# Patient Record
Sex: Female | Born: 1995
Health system: Southern US, Community
[De-identification: ages and names within clinical notes are randomized; demographics above are authoritative.]

## PROBLEM LIST (undated history)

## (undated) DIAGNOSIS — F419 Anxiety disorder, unspecified: Secondary | ICD-10-CM

## (undated) DIAGNOSIS — F909 Attention-deficit hyperactivity disorder, unspecified type: Secondary | ICD-10-CM

---
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
## 2015-05-29 ENCOUNTER — Emergency Department (HOSPITAL_BASED_OUTPATIENT_CLINIC_OR_DEPARTMENT_OTHER): Payer: Self-pay

## 2015-05-29 ENCOUNTER — Emergency Department (HOSPITAL_BASED_OUTPATIENT_CLINIC_OR_DEPARTMENT_OTHER)
Admission: EM | Admit: 2015-05-29 | Discharge: 2015-05-29 | Disposition: A | Discharge status: Home / Self Care | Payer: Self-pay | Attending: Emergency Medicine | Admitting: Emergency Medicine

## 2015-05-29 ENCOUNTER — Encounter (HOSPITAL_BASED_OUTPATIENT_CLINIC_OR_DEPARTMENT_OTHER): Payer: Self-pay | Admitting: Emergency Medicine

## 2015-05-29 DIAGNOSIS — Z720 Tobacco use: Secondary | ICD-10-CM | POA: Insufficient documentation

## 2015-05-29 DIAGNOSIS — Y9289 Other specified places as the place of occurrence of the external cause: Secondary | ICD-10-CM | POA: Insufficient documentation

## 2015-05-29 DIAGNOSIS — W1839XA Other fall on same level, initial encounter: Secondary | ICD-10-CM | POA: Insufficient documentation

## 2015-05-29 DIAGNOSIS — Y998 Other external cause status: Secondary | ICD-10-CM | POA: Insufficient documentation

## 2015-05-29 DIAGNOSIS — S62609A Fracture of unspecified phalanx of unspecified finger, initial encounter for closed fracture: Secondary | ICD-10-CM

## 2015-05-29 DIAGNOSIS — Y9389 Activity, other specified: Secondary | ICD-10-CM | POA: Insufficient documentation

## 2015-05-29 DIAGNOSIS — S62616A Displaced fracture of proximal phalanx of right little finger, initial encounter for closed fracture: Secondary | ICD-10-CM | POA: Insufficient documentation

## 2015-05-29 DIAGNOSIS — Z8659 Personal history of other mental and behavioral disorders: Secondary | ICD-10-CM | POA: Insufficient documentation

## 2015-05-29 HISTORY — DX: Anxiety disorder, unspecified: F41.9

## 2015-05-29 MED ORDER — LIDOCAINE HCL 2 % IJ SOLN
10.0000 mL | Freq: Once | INTRAMUSCULAR | Status: AC
  Administered 2015-05-29: 200 mg via INTRADERMAL
  Filled 2015-05-29: qty 20

## 2015-05-29 MED ORDER — OXYCODONE-ACETAMINOPHEN 5-325 MG PO TABS
2.0000 | ORAL_TABLET | Freq: Once | ORAL | Status: AC
  Administered 2015-05-29: 2 via ORAL
  Filled 2015-05-29: qty 2

## 2015-05-29 MED ORDER — HYDROCODONE-ACETAMINOPHEN 5-325 MG PO TABS: 2.0000 | ORAL_TABLET | ORAL | Status: DC | PRN

## 2015-05-29 NOTE — ED Notes (Signed)
Patient states that she fell last night and hurt her left hand  - left 5th digit is swollen and painful. Patient is sitting in triage eating vienna sausages and reports pain is a 9/10

## 2015-05-29 NOTE — Discharge Instructions (Signed)
Your evaluated in the ED today for your left hand pain. You were found have a fracture of your smallest pinky finger. You have will need to go to the [facility] emergency department at 8:00 AM on Sunday morning for surgery in order to pin your bones together. Your surgeon will be Dr. Amanda Pea. Do not eat or drink anything after 12:00 midnight Sunday morning. You may take her pain medicines as prescribed for your discomfort. Do not take these medications before driving or operating machinery. Return to ED for worsening symptoms.  Finger Fracture Fractures of fingers are breaks in the bones of the fingers. There are many types of fractures. There are different ways of treating these fractures. Your health care provider will discuss the best way to treat your fracture. CAUSES Traumatic injury is the main cause of broken fingers. These include:  Injuries while playing sports.  Workplace injuries.  Falls. RISK FACTORS Activities that can increase your risk of finger fractures include:  Sports.  Workplace activities that involve machinery.  A condition called osteoporosis, which can make your bones less dense and cause them to fracture more easily. SIGNS AND SYMPTOMS The main symptoms of a broken finger are pain and swelling within 15 minutes after the injury. Other symptoms include:  Bruising of your finger.  Stiffness of your finger.  Numbness of your finger.  Exposed bones (compound fracture) if the fracture is severe. DIAGNOSIS  The best way to diagnose a broken bone is with X-ray imaging. Additionally, your health care provider will use this X-ray image to evaluate the position of the broken finger bones.  TREATMENT  Finger fractures can be treated with:   Nonreduction--This means the bones are in place. The finger is splinted without changing the positions of the bone pieces. The splint is usually left on for about a week to 10 days. This will depend on your fracture and what your  health care provider thinks.  Closed reduction--The bones are put back into position without using surgery. The finger is then splinted.  Open reduction and internal fixation--The fracture site is opened. Then the bone pieces are fixed into place with pins or some type of hardware. This is seldom required. It depends on the severity of the fracture. HOME CARE INSTRUCTIONS   Follow your health care provider's instructions regarding activities, exercises, and physical therapy.  Only take over-the-counter or prescription medicines for pain, discomfort, or fever as directed by your health care provider. SEEK MEDICAL CARE IF: You have pain or swelling that limits the motion or use of your fingers. SEEK IMMEDIATE MEDICAL CARE IF:  Your finger becomes numb. MAKE SURE YOU:   Understand these instructions.  Will watch your condition.  Will get help right away if you are not doing well or get worse.   This information is not intended to replace advice given to you by your health care provider. Make sure you discuss any questions you have with your health care provider.   Document Released: 11/19/2000 Document Revised: 05/28/2013 Document Reviewed: 03/19/2013 Elsevier Interactive Patient Education 2016 [employer]  Cast or Splint Care Casts and splints support injured limbs and keep bones from moving while they heal. It is important to care for your cast or splint at home.  HOME CARE INSTRUCTIONS  Keep the cast or splint uncovered during the drying period. It can take 24 to 48 hours to dry if it is made of plaster. A fiberglass cast will dry in less than 1 hour.  Do not rest the cast on anything harder than a pillow for the first 24 hours.  Do not put weight on your injured limb or apply pressure to the cast until your health care provider gives you permission.  Keep the cast or splint dry. Wet casts or splints can lose their shape and may not support the limb as well. A wet cast that  has lost its shape can also create harmful pressure on your skin when it dries. Also, wet skin can become infected.  Cover the cast or splint with a plastic bag when bathing or when out in the rain or snow. If the cast is on the trunk of the body, take sponge baths until the cast is removed.  If your cast does become wet, dry it with a towel or a blow dryer on the cool setting only.  Keep your cast or splint clean. Soiled casts may be wiped with a moistened cloth.  Do not place any hard or soft foreign objects under your cast or splint, such as cotton, toilet paper, lotion, or powder.  Do not try to scratch the skin under the cast with any object. The object could get stuck inside the cast. Also, scratching could lead to an infection. If itching is a problem, use a blow dryer on a cool setting to relieve discomfort.  Do not trim or cut your cast or remove padding from inside of it.  Exercise all joints next to the injury that are not immobilized by the cast or splint. For example, if you have a long leg cast, exercise the hip joint and toes. If you have an arm cast or splint, exercise the shoulder, elbow, thumb, and fingers.  Elevate your injured arm or leg on 1 or 2 pillows for the first 1 to 3 days to decrease swelling and pain.It is best if you can comfortably elevate your cast so it is higher than your heart. SEEK MEDICAL CARE IF:   Your cast or splint cracks.  Your cast or splint is too tight or too loose.  You have unbearable itching inside the cast.  Your cast becomes wet or develops a soft spot or area.  You have a bad smell coming from inside your cast.  You get an object stuck under your cast.  Your skin around the cast becomes red or raw.  You have new pain or worsening pain after the cast has been applied. SEEK IMMEDIATE MEDICAL CARE IF:   You have fluid leaking through the cast.  You are unable to move your fingers or toes.  You have discolored (blue or white),  cool, painful, or very swollen fingers or toes beyond the cast.  You have tingling or numbness around the injured area.  You have severe pain or pressure under the cast.  You have any difficulty with your breathing or have shortness of breath.  You have chest pain.   This information is not intended to replace advice given to you by your health care provider. Make sure you discuss any questions you have with your health care provider.   Document Released: 08/04/2000 Document Revised: 05/28/2013 Document Reviewed: 02/13/2013 Elsevier Interactive Patient Education [employer]

## 2015-05-29 NOTE — ED Provider Notes (Signed)
CSN: [id number]     Arrival date & time 05/29/15  1346 History   First MD Initiated Contact with Patient 05/29/15 1422     Chief Complaint  Patient presents with  . Finger Injury     (Consider location/radiation/quality/duration/timing/severity/associated sxs/prior Treatment) HPI Tanya Collins is a 19 y.o. female who comes in for evaluation of finger injury. Patient states last night she fell down while at the club landing on her right hand. She reports sudden onset pain to her right pinky finger. She reports associated swelling and redness. Movement makes her symptoms worse. Pain is throbbing. Rated as 10/10. Denies any numbness or tingling. She is holding her finger in flexion as any type of extension worsens the pain.  Past Medical History  Diagnosis Date  . Anxiety    History reviewed. No pertinent past surgical history. History reviewed. No pertinent family history. Social History  Substance Use Topics  . Smoking status: Current Every Day Smoker  . Smokeless tobacco: None  . Alcohol Use: Yes     Comment: occ   OB History    No data available     Review of Systems A 10 point review of systems was completed and was negative except for pertinent positives and negatives as mentioned in the history of present illness     Allergies  Review of patient's allergies indicates no known allergies.  Home Medications   Prior to Admission medications   Medication Sig Start Date End Date Taking? Authorizing Provider  HYDROcodone-acetaminophen (NORCO) 5-325 MG tablet Take 2 tablets by mouth every 4 (four) hours as needed. 05/29/15   Joycie Peek, PA-C   BP 116/64 mmHg  Pulse 71  Temp(Src) 98.6 F (37 C) (Oral)  Resp 16  Ht  (1.575 m)  Wt 153 lb (69.4 kg)  BMI 27.98 kg/m2  SpO2 100%  LMP 04/29/2015 Physical Exam  Constitutional:  Awake, alert, nontoxic appearance.  HENT:  Head: Atraumatic.  Eyes: Right eye exhibits no discharge. Left eye exhibits no discharge.   Neck: Neck supple.  Pulmonary/Chest: Effort normal. She exhibits no tenderness.  Abdominal: Soft. There is no tenderness. There is no rebound.  Musculoskeletal: She exhibits no tenderness.  Right fifth digit out in flexion. Significant swelling and ecchymosis to the PIP joint and proximal phalanx. No open wounds. Distal pulses are intact with brisk cap refill.  Neurological:  Mental status and motor strength appears baseline for patient and situation. Motor strength is decreased in right fifth digit due to pain. Sensation intact to light touch.  Skin: No rash noted.  Psychiatric: She has a normal mood and affect.  Nursing note and vitals reviewed.   ED Course  Reduction of fracture Date/Time: 05/29/2015 3:37 PM Performed by: Joycie Peek Authorized by: Joycie Peek Consent: Verbal consent obtained. Risks and benefits: risks, benefits and alternatives were discussed Consent given by: patient Patient understanding: patient states understanding of the procedure being performed Patient identity confirmed: verbally with patient Preparation: Patient was prepped and draped in the usual sterile fashion. Local anesthesia used: yes Anesthesia: digital block Local anesthetic: lidocaine 2% without epinephrine Anesthetic total: 2 ml Patient sedated: no Patient tolerance: Patient tolerated the procedure well with no immediate complications Comments: Patient with improved range of motion following reduction.   SPLINT APPLICATION Date/Time: 4:47 PM Authorized by: Sharlene Motts Consent: Verbal consent obtained. Risks and benefits: risks, benefits and alternatives were discussed Consent given by: patient Splint applied by: orthopedic technician Location details: Left hand and wrist  Splint type: Ulnar gutter  Supplies used: wrap and cast form Post-procedure: The splinted body part was neurovascularly unchanged following the procedure. Patient tolerance: Patient tolerated the  procedure well with no immediate complications.    (including critical care time)  Labs Review Labs Reviewed - No data to display  Imaging Review Dg Hand Complete Right  05/29/2015   CLINICAL DATA:  Patient states that she fell last night and hurt her left hand - left 5th digit is swollen and painful. Pt unable to move or extend 5th digit, last image image is anteroposterior of just 5th digit to better demonstrate the 1st and 2nd phalanges  EXAM: RIGHT HAND - COMPLETE 3+ VIEW  COMPARISON:  None.  FINDINGS: Transverse comminuted fracture of the proximal shaft proximal phalanx right little finger, with several mm of palmar displacement and dorsal angulation of the distal fracture fragment. There is no intra-articular extension of the fracture. The PIP is held in flexion. No other acute bony injuries at evident. Elsewhere normal mineralization and alignment without significant osseous degenerative change.  IMPRESSION: 1. Comminuted angulated fracture, proximal phalanx right little finger.   Electronically Signed   By: Corlis Leak M.D.   On: 05/29/2015 14:55   I have personally reviewed and evaluated these images and lab results as part of my medical decision-making.   EKG Interpretation None     Meds given in ED:  Medications  lidocaine (XYLOCAINE) 2 % (with pres) injection 200 mg (not administered)  oxyCODONE-acetaminophen (PERCOCET/ROXICET) 5-325 MG per tablet 2 tablet (2 tablets Oral Given 05/29/15 1512)    New Prescriptions   HYDROCODONE-ACETAMINOPHEN (NORCO) 5-325 MG TABLET    Take 2 tablets by mouth every 4 (four) hours as needed.   Filed Vitals:   05/29/15 1353  BP: 116/64  Pulse: 71  Temp: 98.6 F (37 C)  TempSrc: Oral  Resp: 16  Height:  (1.575 m)  Weight: 153 lb (69.4 kg)  SpO2: 100%    MDM  Patient presents to ED for right finger pain after sustaining a fall last night.  Vitals stable - WNL -afebrile Pt resting comfortably in ED. pain treated in ED. PE--right  fifth digit held in flexion. Unable to extend due to pain. Neurovascularly intact. Imaging--x-ray of right fifth digit FINDINGS: Transverse comminuted fracture of the proximal shaft proximal phalanx right little finger, with several mm of palmar displacement and dorsal angulation of the distal fracture fragment. There is no intra-articular extension of the fracture. The PIP is held in flexion  Will consult with hand surgery. Discussed with Dr. Amanda Pea. Will place patient in splint. Will need to be seen for surgical pinning. Patient recently ate PTA and will be unable to have surgery performed today. We'll send to [facility] ED 8:00 AM Sunday morning for surgical evaluation. I discussed all relevant lab findings and imaging results with pt and they verbalized understanding. Discussed f/u with PCP within 48 hrs and return precautions, pt very amenable to plan. Prior to patient discharge, I discussed and reviewed this case with Dr.Gentry  Final diagnoses:  Finger fracture, left, closed, initial encounter        Joycie Peek, PA-C 05/29/15 2049  Mirian Mo, MD 06/05/15 [phone]

## 2015-05-30 ENCOUNTER — Ambulatory Visit (HOSPITAL_COMMUNITY)
Admission: RE | Admit: 2015-05-30 | Discharge: 2015-05-30 | Disposition: A | Discharge status: Home / Self Care | Payer: Self-pay | Source: Ambulatory Visit | Attending: Orthopedic Surgery | Admitting: Orthopedic Surgery

## 2015-05-30 ENCOUNTER — Encounter (HOSPITAL_COMMUNITY)
Admission: RE | Disposition: A | Discharge status: Home / Self Care | Payer: Self-pay | Source: Ambulatory Visit | Attending: Orthopedic Surgery

## 2015-05-30 ENCOUNTER — Ambulatory Visit (HOSPITAL_COMMUNITY): Payer: Self-pay | Admitting: Anesthesiology

## 2015-05-30 DIAGNOSIS — W19XXXA Unspecified fall, initial encounter: Secondary | ICD-10-CM | POA: Insufficient documentation

## 2015-05-30 DIAGNOSIS — F17200 Nicotine dependence, unspecified, uncomplicated: Secondary | ICD-10-CM | POA: Insufficient documentation

## 2015-05-30 DIAGNOSIS — S62616A Displaced fracture of proximal phalanx of right little finger, initial encounter for closed fracture: Secondary | ICD-10-CM | POA: Insufficient documentation

## 2015-05-30 DIAGNOSIS — Z79891 Long term (current) use of opiate analgesic: Secondary | ICD-10-CM | POA: Insufficient documentation

## 2015-05-30 HISTORY — PX: CLOSED REDUCTION FINGER WITH PERCUTANEOUS PINNING: SHX5612

## 2015-05-30 SURGERY — CLOSED REDUCTION, FINGER, WITH PERCUTANEOUS PINNING
Anesthesia: General | Laterality: Right

## 2015-05-30 MED ORDER — OXYCODONE HCL 5 MG/5ML PO SOLN: 5.0000 mg | Freq: Once | ORAL | Status: AC | PRN

## 2015-05-30 MED ORDER — MIDAZOLAM HCL 5 MG/5ML IJ SOLN
INTRAMUSCULAR | Status: DC | PRN
  Administered 2015-05-30: 2 mg via INTRAVENOUS

## 2015-05-30 MED ORDER — LIDOCAINE HCL (CARDIAC) 20 MG/ML IV SOLN
INTRAVENOUS | Status: AC
  Filled 2015-05-30: qty 5

## 2015-05-30 MED ORDER — ROCURONIUM BROMIDE 50 MG/5ML IV SOLN
INTRAVENOUS | Status: AC
  Filled 2015-05-30: qty 1

## 2015-05-30 MED ORDER — HYDROCODONE-ACETAMINOPHEN 5-325 MG PO TABS: 2.0000 | ORAL_TABLET | Freq: Four times a day (QID) | ORAL | Status: DC | PRN

## 2015-05-30 MED ORDER — NEOSTIGMINE METHYLSULFATE 10 MG/10ML IV SOLN
INTRAVENOUS | Status: AC
  Filled 2015-05-30: qty 1

## 2015-05-30 MED ORDER — OXYCODONE HCL 5 MG PO TABS
ORAL_TABLET | ORAL | Status: AC
  Filled 2015-05-30: qty 1

## 2015-05-30 MED ORDER — HYDROMORPHONE HCL 1 MG/ML IJ SOLN: 0.2500 mg | INTRAMUSCULAR | Status: DC | PRN

## 2015-05-30 MED ORDER — DEXAMETHASONE SODIUM PHOSPHATE 10 MG/ML IJ SOLN
INTRAMUSCULAR | Status: AC
  Filled 2015-05-30: qty 1

## 2015-05-30 MED ORDER — GLYCOPYRROLATE 0.2 MG/ML IJ SOLN
INTRAMUSCULAR | Status: AC
  Filled 2015-05-30: qty 4

## 2015-05-30 MED ORDER — LIDOCAINE HCL (CARDIAC) 20 MG/ML IV SOLN
INTRAVENOUS | Status: DC | PRN
  Administered 2015-05-30: 60 mg via INTRAVENOUS

## 2015-05-30 MED ORDER — ONDANSETRON HCL 4 MG/2ML IJ SOLN
INTRAMUSCULAR | Status: DC | PRN
  Administered 2015-05-30: 4 mg via INTRAVENOUS

## 2015-05-30 MED ORDER — BUPIVACAINE HCL (PF) 0.25 % IJ SOLN
INTRAMUSCULAR | Status: DC | PRN
  Administered 2015-05-30: 10 mL

## 2015-05-30 MED ORDER — BUPIVACAINE HCL (PF) 0.25 % IJ SOLN
INTRAMUSCULAR | Status: AC
  Filled 2015-05-30: qty 30

## 2015-05-30 MED ORDER — MIDAZOLAM HCL 2 MG/2ML IJ SOLN
INTRAMUSCULAR | Status: AC
  Filled 2015-05-30: qty 4

## 2015-05-30 MED ORDER — CEFAZOLIN SODIUM-DEXTROSE 2-3 GM-% IV SOLR
INTRAVENOUS | Status: AC
  Filled 2015-05-30: qty 50

## 2015-05-30 MED ORDER — MEPERIDINE HCL 25 MG/ML IJ SOLN: 6.2500 mg | INTRAMUSCULAR | Status: DC | PRN

## 2015-05-30 MED ORDER — LACTATED RINGERS IV SOLN
INTRAVENOUS | Status: DC
  Administered 2015-05-30: 11:00:00 via INTRAVENOUS

## 2015-05-30 MED ORDER — PROPOFOL 10 MG/ML IV BOLUS
INTRAVENOUS | Status: AC
  Filled 2015-05-30: qty 20

## 2015-05-30 MED ORDER — OXYCODONE HCL 5 MG PO TABS
5.0000 mg | ORAL_TABLET | Freq: Once | ORAL | Status: AC | PRN
Start: 2015-05-30 — End: 2015-05-30
  Administered 2015-05-30: 5 mg via ORAL

## 2015-05-30 MED ORDER — FENTANYL CITRATE (PF) 100 MCG/2ML IJ SOLN
INTRAMUSCULAR | Status: DC | PRN
  Administered 2015-05-30: 100 ug via INTRAVENOUS
  Administered 2015-05-30: 50 ug via INTRAVENOUS

## 2015-05-30 MED ORDER — PROPOFOL 10 MG/ML IV BOLUS
INTRAVENOUS | Status: DC | PRN
  Administered 2015-05-30: 200 mg via INTRAVENOUS

## 2015-05-30 MED ORDER — CEPHALEXIN 500 MG PO CAPS: 500.0000 mg | ORAL_CAPSULE | Freq: Four times a day (QID) | ORAL | Status: DC

## 2015-05-30 MED ORDER — ONDANSETRON HCL 4 MG/2ML IJ SOLN
INTRAMUSCULAR | Status: AC
  Filled 2015-05-30: qty 2

## 2015-05-30 MED ORDER — FENTANYL CITRATE (PF) 250 MCG/5ML IJ SOLN
INTRAMUSCULAR | Status: AC
  Filled 2015-05-30: qty 5

## 2015-05-30 MED ORDER — CEFAZOLIN SODIUM-DEXTROSE 2-3 GM-% IV SOLR
INTRAVENOUS | Status: DC | PRN
  Administered 2015-05-30: 2 g via INTRAVENOUS

## 2015-05-30 SURGICAL SUPPLY — 51 items
BANDAGE ELASTIC 3 VELCRO ST LF (GAUZE/BANDAGES/DRESSINGS) ×2 IMPLANT
BANDAGE ELASTIC 4 VELCRO ST LF (GAUZE/BANDAGES/DRESSINGS) IMPLANT
BNDG COHESIVE 1X5 TAN STRL LF (GAUZE/BANDAGES/DRESSINGS) ×2 IMPLANT
BNDG CONFORM 2 STRL LF (GAUZE/BANDAGES/DRESSINGS) IMPLANT
BNDG GAUZE ELAST 4 BULKY (GAUZE/BANDAGES/DRESSINGS) ×4 IMPLANT
CORDS BIPOLAR (ELECTRODE) ×1 IMPLANT
COVER SURGICAL LIGHT HANDLE (MISCELLANEOUS) ×3 IMPLANT
CUFF TOURNIQUET SINGLE 18IN (TOURNIQUET CUFF) ×3 IMPLANT
CUFF TOURNIQUET SINGLE 24IN (TOURNIQUET CUFF) IMPLANT
DRAPE OEC MINIVIEW 54X84 (DRAPES) ×2 IMPLANT
DRAPE SURG 17X23 STRL (DRAPES) ×1 IMPLANT
GAUZE SPONGE 2X2 8PLY STRL LF (GAUZE/BANDAGES/DRESSINGS) IMPLANT
GAUZE SPONGE 4X4 12PLY STRL (GAUZE/BANDAGES/DRESSINGS) ×2 IMPLANT
GAUZE XEROFORM 1X8 LF (GAUZE/BANDAGES/DRESSINGS) ×2 IMPLANT
GLOVE BIOGEL M STRL SZ7.5 (GLOVE) ×1 IMPLANT
GLOVE SS BIOGEL STRL SZ 8 (GLOVE) ×1 IMPLANT
GLOVE SUPERSENSE BIOGEL SZ 8 (GLOVE) ×2
GOWN STRL REUS W/ TWL LRG LVL3 (GOWN DISPOSABLE) ×2 IMPLANT
GOWN STRL REUS W/ TWL XL LVL3 (GOWN DISPOSABLE) ×3 IMPLANT
GOWN STRL REUS W/TWL LRG LVL3 (GOWN DISPOSABLE) ×6
GOWN STRL REUS W/TWL XL LVL3 (GOWN DISPOSABLE) ×9
K-WIRE .045X6 DBL TRO NS (WIRE) ×6
KIT BASIN OR (CUSTOM PROCEDURE TRAY) ×3 IMPLANT
KIT ROOM TURNOVER OR (KITS) ×3 IMPLANT
KWIRE .045X6 DBL TRO NS (WIRE) IMPLANT
MANIFOLD NEPTUNE II (INSTRUMENTS) ×1 IMPLANT
NDL HYPO 25GX1X1/2 BEV (NEEDLE) IMPLANT
NEEDLE HYPO 25GX1X1/2 BEV (NEEDLE) ×3 IMPLANT
NS IRRIG 1000ML POUR BTL (IV SOLUTION) ×3 IMPLANT
PACK ORTHO EXTREMITY (CUSTOM PROCEDURE TRAY) ×3 IMPLANT
PAD ARMBOARD 7.5X6 YLW CONV (MISCELLANEOUS) ×6 IMPLANT
PAD CAST 3X4 CTTN HI CHSV (CAST SUPPLIES) IMPLANT
PAD CAST 4YDX4 CTTN HI CHSV (CAST SUPPLIES) IMPLANT
PADDING CAST COTTON 3X4 STRL (CAST SUPPLIES) ×3
PADDING CAST COTTON 4X4 STRL (CAST SUPPLIES) ×3
SOLUTION BETADINE 4OZ (MISCELLANEOUS) ×3 IMPLANT
SPECIMEN JAR SMALL (MISCELLANEOUS) ×1 IMPLANT
SPLINT FIBERGLASS 3X12 (CAST SUPPLIES) ×2 IMPLANT
SPONGE GAUZE 2X2 STER 10/PKG (GAUZE/BANDAGES/DRESSINGS) ×2
SPONGE GAUZE 4X4 12PLY STER LF (GAUZE/BANDAGES/DRESSINGS) ×2 IMPLANT
SPONGE SCRUB IODOPHOR (GAUZE/BANDAGES/DRESSINGS) ×3 IMPLANT
SUCTION FRAZIER TIP 10 FR DISP (SUCTIONS) IMPLANT
SUT MERSILENE 4 0 P 3 (SUTURE) IMPLANT
SUT PROLENE 4 0 PS 2 18 (SUTURE) IMPLANT
SYR CONTROL 10ML LL (SYRINGE) ×2 IMPLANT
TOWEL OR 17X24 6PK STRL BLUE (TOWEL DISPOSABLE) ×3 IMPLANT
TOWEL OR 17X26 10 PK STRL BLUE (TOWEL DISPOSABLE) ×3 IMPLANT
TUBE CONNECTING 12'X1/4 (SUCTIONS)
TUBE CONNECTING 12X1/4 (SUCTIONS) IMPLANT
UNDERPAD 30X30 INCONTINENT (UNDERPADS AND DIAPERS) ×3 IMPLANT
WATER STERILE IRR 1000ML POUR (IV SOLUTION) ×1 IMPLANT

## 2015-05-30 NOTE — Discharge Instructions (Signed)
.  We recommend that you to take vitamin C 1000 mg a day to promote healing. °We also recommend that if you require  pain medicine that you take a stool softener to prevent constipation as most pain medicines will have constipation side effects. We recommend either Peri-Colace or Senokot and recommend that you also consider adding MiraLAX to prevent the constipation affects from pain medicine if you are required to use them. These medicines are over the counter and maybe purchased at a local pharmacy. A cup of yogurt and a probiotic can also be helpful during the recovery process as the medicines can disrupt your intestinal environment. °.Keep bandage clean and dry.  Call for any problems.  No smoking.  Criteria for driving a car: you should be off your pain medicine for 7-8 hours, able to drive one handed(confident), thinking clearly and feeling able in your judgement to drive. °Continue elevation as it will decrease swelling.  If instructed by MD move your fingers within the confines of the bandage/splint.  Use ice if instructed by your MD. Call immediately for any sudden loss of feeling in your hand/arm or change in functional abilities of the extremity. °

## 2015-05-30 NOTE — Transfer of Care (Signed)
Immediate Anesthesia Transfer of Care Note  Patient: Tanya Collins  Procedure(s) Performed: Procedure(s): CLOSED REDUCTION FINGER WITH PERCUTANEOUS PINNING (Right)  Patient Location: PACU  Anesthesia Type:General  Level of Consciousness: sedated  Airway & Oxygen Therapy: Patient Spontanous Breathing and Patient connected to nasal cannula oxygen  Post-op Assessment: Report given to RN and Post -op Vital signs reviewed and stable  Post vital signs: stable  Last Vitals:  Filed Vitals:   05/30/15 1302  BP: 106/61  Pulse: 68  Temp: 36.4 C  Resp: 14    Complications: No apparent anesthesia complications

## 2015-05-30 NOTE — Op Note (Signed)
See [id number] Amanda Pea MD

## 2015-05-30 NOTE — Progress Notes (Signed)
preg test not necessary per Dr Ivin Booty

## 2015-05-30 NOTE — Anesthesia Preprocedure Evaluation (Signed)
Anesthesia Evaluation  Patient identified by MRN, date of birth, ID band Patient awake    Reviewed: Allergy & Precautions, NPO status , Patient's Chart, lab work & pertinent test results  Airway Mallampati: I  TM Distance: >3 FB Neck ROM: Full    Dental  (+) Teeth Intact, Dental Advisory Given   Pulmonary Current Smoker,  breath sounds clear to auscultation        Cardiovascular Rhythm:Regular Rate:Normal     Neuro/Psych    GI/Hepatic   Endo/Other    Renal/GU      Musculoskeletal   Abdominal   Peds  Hematology   Anesthesia Other Findings   Reproductive/Obstetrics                             Anesthesia Physical Anesthesia Plan  ASA: I  Anesthesia Plan: General   Post-op Pain Management:    Induction: Intravenous  Airway Management Planned: LMA  Additional Equipment:   Intra-op Plan:   Post-operative Plan: Extubation in OR  Informed Consent: I have reviewed the patients History and Physical, chart, labs and discussed the procedure including the risks, benefits and alternatives for the proposed anesthesia with the patient or authorized representative who has indicated his/her understanding and acceptance.   Dental advisory given  Plan Discussed with: CRNA, Anesthesiologist and Surgeon  Anesthesia Plan Comments:         Anesthesia Quick Evaluation  

## 2015-05-30 NOTE — Anesthesia Procedure Notes (Signed)
Procedure Name: LMA Insertion Date/Time: 05/30/2015 12:26 PM Performed by: Fanny Dance L Pre-anesthesia Checklist: Patient identified, Emergency Drugs available, Suction available and Patient being monitored Patient Re-evaluated:Patient Re-evaluated prior to inductionOxygen Delivery Method: Circle system utilized Preoxygenation: Pre-oxygenation with 100% oxygen Intubation Type: IV induction Ventilation: Mask ventilation without difficulty LMA: LMA inserted LMA Size: 4.0 Number of attempts: 1 Placement Confirmation: positive ETCO2,  CO2 detector and breath sounds checked- equal and bilateral Dental Injury: Teeth and Oropharynx as per pre-operative assessment

## 2015-05-30 NOTE — H&P (Signed)
Tanya Collins is an 19 y.o. female.   Chief Complaint: Right small finger fracture proximal phalanx displaced HPI: Patient presents for closed reduction versus open reduction and pinning small finger displaced proximal phalanx fracture right upper extremity  She denies other injury.  She denies neck back chest abdominal pain.  She is alert and oriented.  She is here today with her family  Past Medical History  Diagnosis Date  . Anxiety     No past surgical history on file.  No family history on file. Social History:  reports that she has been smoking.  She does not have any smokeless tobacco history on file. She reports that she drinks alcohol. She reports that she does not use illicit drugs.  Allergies: No Known Allergies  Medications Prior to Admission  Medication Sig Dispense Refill  . HYDROcodone-acetaminophen (NORCO) 5-325 MG tablet Take 2 tablets by mouth every 4 (four) hours as needed. 6 tablet 0    No results found for this or any previous visit (from the past 48 hour(s)). Dg Hand Complete Right  05/29/2015   CLINICAL DATA:  Patient states that she fell last night and hurt her left hand - left 5th digit is swollen and painful. Pt unable to move or extend 5th digit, last image image is anteroposterior of just 5th digit to better demonstrate the 1st and 2nd phalanges  EXAM: RIGHT HAND - COMPLETE 3+ VIEW  COMPARISON:  None.  FINDINGS: Transverse comminuted fracture of the proximal shaft proximal phalanx right little finger, with several mm of palmar displacement and dorsal angulation of the distal fracture fragment. There is no intra-articular extension of the fracture. The PIP is held in flexion. No other acute bony injuries at evident. Elsewhere normal mineralization and alignment without significant osseous degenerative change.  IMPRESSION: 1. Comminuted angulated fracture, proximal phalanx right little finger.   Electronically Signed   By: Corlis Leak M.D.   On: 05/29/2015  14:55    Review of Systems  Constitutional: Negative.   Cardiovascular: Negative.   Gastrointestinal: Negative.   Genitourinary: Negative.   Skin: Negative.   Endo/Heme/Allergies: Negative.   Psychiatric/Behavioral: Positive for depression.    Last menstrual period 04/29/2015. Physical Exam  Right small finger displaced fracture proximal phalanx she is intact sensation and motor function about the remaining digits. This is a closed injury.  The patient is alert and oriented in no acute distress. The patient complains of pain in the affected upper extremity.  The patient is noted to have a normal HEENT exam. Lung fields show equal chest expansion and no shortness of breath. Abdomen exam is nontender without distention. Lower extremity examination does not show any fracture dislocation or blood clot symptoms. Pelvis is stable and the neck and back are stable and nontender. Assessment/Plan We'll plan for closed versus open reduction proximal phalanx fracture right small finger  We are planning surgery for your upper extremity. The risk and benefits of surgery to include risk of bleeding, infection, anesthesia,  damage to normal structures and failure of the surgery to accomplish its intended goals of relieving symptoms and restoring function have been discussed in detail. With this in mind we plan to proceed. I have specifically discussed with the patient the pre-and postoperative regime and the dos and don'ts and risk and benefits in great detail. Risk and benefits of surgery also include risk of dystrophy(CRPS), chronic nerve pain, failure of the healing process to go onto completion and other inherent risks of surgery The relavent the  pathophysiology of the disease/injury process, as well as the alternatives for treatment and postoperative course of action has been discussed in great detail with the patient who desires to proceed.  We will do everything in our power to help you (the  patient) restore function to the upper extremity. It is a pleasure to see this patient today.   Inetha Maret III,Haddy Mullinax M 05/30/2015, 9:01 AM

## 2015-05-30 NOTE — Op Note (Signed)
Tanya Collins, Tanya Collins NO.:  [id number]  MEDICAL RECORD NO.:  [id number]  LOCATION:  [facility]                         FACILITY:  [facility]  PHYSICIAN:  Dionne Ano. Syan Cullimore, M.D.DATE OF BIRTH:  21-Aug-1996  DATE OF PROCEDURE: DATE OF DISCHARGE:  05/30/2015                              OPERATIVE REPORT   PREOPERATIVE DIAGNOSIS:  Displaced right small finger proximal phalanx fracture.  POSTOPERATIVE DIAGNOSIS:  Displaced right small finger proximal phalanx fracture.  PROCEDURE: 1. Closed reduction and pinning, right small finger proximal phalanx     fracture. 2. AP, lateral, and oblique x-rays performed, examined, and     interpreted by myself, right hand.  SURGEON:  Dionne Ano. Amanda Pea, M.D.  ASSISTANT:  None.  COMPLICATION:  None.  ANESTHESIA:  General.  TOURNIQUET TIME:  Zero.  INDICATIONS:  A pleasant female, presents with the above-mentioned diagnosis. I have counseled in regard to risks and benefits of surgery. She has a displaced unstable proximal phalanx fracture.  I have discussed the risks and benefits, do's and don'ts, timeframe duration of recovery.  With this in mind, we will proceed accordingly.  OPERATION IN DETAIL:  The patient was seen by myself and Anesthesia, taken to operative suite, underwent smooth induction of general anesthetic, 2 g of Ancef were given.  She was prepped draped in usual sterile fashion.  Following this, time-out was called.  Closed reduction was accomplished followed by pinning with two 0.045 K-wires in an Roderic Scarce- Belsky technique.  The MCP and PIP joints were free from the encroachment of the pins and she did quite well.  Following this, final x-rays were made.  Stress radiography revealed excellent position.  I was quite pleased with this.  Once this was complete, we then performed very careful and cautious placement of Xeroform over the pin sites and I did place a small amount of Sensorcaine without epinephrine in the  area.  The patient tolerated this well.  This is a closed reduction and pinning with 0.045 K-wire.  Right small finger proximal phalanx.  She tolerated this well.  There were no issues, complications or other problems.  She will be monitored closely and we will look forward to seeing her back in the office in 10-14 days.     Dionne Ano. Amanda Pea, M.D.     [facility]  D:  05/30/2015  T:  05/30/2015  Job:  [id number]

## 2015-05-30 NOTE — Anesthesia Postprocedure Evaluation (Signed)
  Anesthesia Post-op Note  Patient: Tanya Collins  Procedure(s) Performed: Procedure(s): CLOSED REDUCTION FINGER WITH PERCUTANEOUS PINNING (Right)  Patient Location: PACU  Anesthesia Type: General   Level of Consciousness: awake, alert  and oriented  Airway and Oxygen Therapy: Patient Spontanous Breathing  Post-op Pain: mild  Post-op Assessment: Post-op Vital signs reviewed  Post-op Vital Signs: Reviewed  Last Vitals:  Filed Vitals:   05/30/15 1315  BP: 118/63  Pulse: 59  Temp:   Resp: 15    Complications: No apparent anesthesia complications

## 2015-05-31 ENCOUNTER — Encounter (HOSPITAL_COMMUNITY): Payer: Self-pay | Admitting: Orthopedic Surgery

## 2015-07-14 ENCOUNTER — Emergency Department (HOSPITAL_BASED_OUTPATIENT_CLINIC_OR_DEPARTMENT_OTHER): Payer: Self-pay

## 2015-07-14 ENCOUNTER — Encounter (HOSPITAL_BASED_OUTPATIENT_CLINIC_OR_DEPARTMENT_OTHER): Payer: Self-pay

## 2015-07-14 ENCOUNTER — Emergency Department (HOSPITAL_BASED_OUTPATIENT_CLINIC_OR_DEPARTMENT_OTHER)
Admission: EM | Admit: 2015-07-14 | Discharge: 2015-07-14 | Disposition: A | Discharge status: Home / Self Care | Payer: Self-pay | Attending: Emergency Medicine | Admitting: Emergency Medicine

## 2015-07-14 DIAGNOSIS — Y9389 Activity, other specified: Secondary | ICD-10-CM | POA: Insufficient documentation

## 2015-07-14 DIAGNOSIS — S81811A Laceration without foreign body, right lower leg, initial encounter: Secondary | ICD-10-CM

## 2015-07-14 DIAGNOSIS — F17210 Nicotine dependence, cigarettes, uncomplicated: Secondary | ICD-10-CM | POA: Insufficient documentation

## 2015-07-14 DIAGNOSIS — S80212A Abrasion, left knee, initial encounter: Secondary | ICD-10-CM

## 2015-07-14 DIAGNOSIS — Y9289 Other specified places as the place of occurrence of the external cause: Secondary | ICD-10-CM | POA: Insufficient documentation

## 2015-07-14 DIAGNOSIS — Y998 Other external cause status: Secondary | ICD-10-CM | POA: Insufficient documentation

## 2015-07-14 DIAGNOSIS — Z792 Long term (current) use of antibiotics: Secondary | ICD-10-CM | POA: Insufficient documentation

## 2015-07-14 DIAGNOSIS — S0990XA Unspecified injury of head, initial encounter: Secondary | ICD-10-CM

## 2015-07-14 DIAGNOSIS — Z23 Encounter for immunization: Secondary | ICD-10-CM | POA: Insufficient documentation

## 2015-07-14 DIAGNOSIS — S91011A Laceration without foreign body, right ankle, initial encounter: Secondary | ICD-10-CM | POA: Insufficient documentation

## 2015-07-14 DIAGNOSIS — Z8659 Personal history of other mental and behavioral disorders: Secondary | ICD-10-CM | POA: Insufficient documentation

## 2015-07-14 MED ORDER — BACITRACIN ZINC 500 UNIT/GM EX OINT
TOPICAL_OINTMENT | Freq: Once | CUTANEOUS | Status: AC
  Administered 2015-07-14: 15.5556 via TOPICAL
  Filled 2015-07-14: qty 28.35

## 2015-07-14 MED ORDER — TETANUS-DIPHTH-ACELL PERTUSSIS 5-2.5-18.5 LF-MCG/0.5 IM SUSP
0.5000 mL | Freq: Once | INTRAMUSCULAR | Status: AC
  Administered 2015-07-14: 0.5 mL via INTRAMUSCULAR
  Filled 2015-07-14: qty 0.5

## 2015-07-14 MED ORDER — LIDOCAINE-EPINEPHRINE 2 %-1:100000 IJ SOLN
INTRAMUSCULAR | Status: AC
  Filled 2015-07-14: qty 1

## 2015-07-14 MED ORDER — LIDOCAINE-EPINEPHRINE 2 %-1:100000 IJ SOLN
20.0000 mL | Freq: Once | INTRAMUSCULAR | Status: AC
  Administered 2015-07-14: 20 mL via INTRADERMAL
  Filled 2015-07-14: qty 1
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
## 2015-07-14 NOTE — Discharge Instructions (Signed)
Head Injury, Adult You have a head injury. Headaches and throwing up (vomiting) are common after a head injury. It should be easy to wake up from sleeping. Sometimes you must stay in the hospital. Most problems happen within the first 24 hours. Side effects may occur up to 7-10 days after the injury.  WHAT ARE THE TYPES OF HEAD INJURIES? Head injuries can be as minor as a bump. Some head injuries can be more severe. More severe head injuries include:  A jarring injury to the brain (concussion).  A bruise of the brain (contusion). This mean there is bleeding in the brain that can cause swelling.  A cracked skull (skull fracture).  Bleeding in the brain that collects, clots, and forms a bump (hematoma). WHEN SHOULD I GET HELP RIGHT AWAY?   You are confused or sleepy.  You cannot be woken up.  You feel sick to your stomach (nauseous) or keep throwing up (vomiting).  Your dizziness or unsteadiness is getting worse.  You have very bad, lasting headaches that are not helped by medicine. Take medicines only as told by your doctor.  You cannot use your arms or legs like normal.  You cannot walk.  You notice changes in the black spots in the center of the colored part of your eye (pupil).  You have clear or bloody fluid coming from your nose or ears.  You have trouble seeing. During the next 24 hours after the injury, you must stay with someone who can watch you. This person should get help right away (call 911 in the [place] if you start to shake and are not able to control it (have seizures), you pass out, or you are unable to wake up. HOW CAN I PREVENT A HEAD INJURY IN THE FUTURE?  Wear seat belts.  Wear a helmet while bike riding and playing sports like football.  Stay away from dangerous activities around the house. WHEN CAN I RETURN TO NORMAL ACTIVITIES AND ATHLETICS? See your doctor before doing these activities. You should not do normal activities or play contact sports until 1  week after the following symptoms have stopped:  Headache that does not go away.  Dizziness.  Poor attention.  Confusion.  Memory problems.  Sickness to your stomach or throwing up.  Tiredness.  Fussiness.  Bothered by bright lights or loud noises.  Anxiousness or depression.  Restless sleep. MAKE SURE YOU:   Understand these instructions.  Will watch your condition.  Will get help right away if you are not doing well or get worse.   This information is not intended to replace advice given to you by your health care provider. Make sure you discuss any questions you have with your health care provider.   Document Released: 07/20/2008 Document Revised: 08/28/2014 Document Reviewed: 04/14/2013 Elsevier Interactive Patient Education 2016 [place], Adult A laceration is a cut that goes through all of the layers of the skin and into the tissue that is right under the skin. Some lacerations heal on their own. Others need to be closed with stitches (sutures), staples, skin adhesive strips, or skin glue. Proper laceration care minimizes the risk of infection and helps the laceration to heal better. HOW TO CARE FOR YOUR LACERATION If sutures or staples were used:  Keep the wound clean and dry.  If you were given a bandage (dressing), you should change it at least one time per day or as told by your health care provider. You should also  change it if it becomes wet or dirty.  Keep the wound completely dry for the first 24 hours or as told by your health care provider. After that time, you may shower or bathe. However, make sure that the wound is not soaked in water until after the sutures or staples have been removed.  Clean the wound one time each day or as told by your health care provider:  Wash the wound with soap and water.  Rinse the wound with water to remove all soap.  Pat the wound dry with a clean towel. Do not rub the wound.  After cleaning  the wound, apply a thin layer of antibiotic ointmentas told by your health care provider. This will help to prevent infection and keep the dressing from sticking to the wound.  Have the sutures or staples removed as told by your health care provider. If skin adhesive strips were used:  Keep the wound clean and dry.  If you were given a bandage (dressing), you should change it at least one time per day or as told by your health care provider. You should also change it if it becomes dirty or wet.  Do not get the skin adhesive strips wet. You may shower or bathe, but be careful to keep the wound dry.  If the wound gets wet, pat it dry with a clean towel. Do not rub the wound.  Skin adhesive strips fall off on their own. You may trim the strips as the wound heals. Do not remove skin adhesive strips that are still stuck to the wound. They will fall off in time. If skin glue was used:  Try to keep the wound dry, but you may briefly wet it in the shower or bath. Do not soak the wound in water, such as by swimming.  After you have showered or bathed, gently pat the wound dry with a clean towel. Do not rub the wound.  Do not do any activities that will make you sweat heavily until the skin glue has fallen off on its own.  Do not apply liquid, cream, or ointment medicine to the wound while the skin glue is in place. Using those may loosen the film before the wound has healed.  If you were given a bandage (dressing), you should change it at least one time per day or as told by your health care provider. You should also change it if it becomes dirty or wet.  If a dressing is placed over the wound, be careful not to apply tape directly over the skin glue. Doing that may cause the glue to be pulled off before the wound has healed.  Do not pick at the glue. The skin glue usually remains in place for 5-10 days, then it falls off of the skin. General Instructions  Take over-the-counter and  prescription medicines only as told by your health care provider.  If you were prescribed an antibiotic medicine or ointment, take or apply it as told by your doctor. Do not stop using it even if your condition improves.  To help prevent scarring, make sure to cover your wound with sunscreen whenever you are outside after stitches are removed, after adhesive strips are removed, or when glue remains in place and the wound is healed. Make sure to wear a sunscreen of at least 30 SPF.  Do not scratch or pick at the wound.  Keep all follow-up visits as told by your health care provider. This is important.  Check your wound every day for signs of infection. Watch for:  Redness, swelling, or pain.  Fluid, blood, or pus.  Raise (elevate) the injured area above the level of your heart while you are sitting or lying down, if possible. SEEK MEDICAL CARE IF:  You received a tetanus shot and you have swelling, severe pain, redness, or bleeding at the injection site.  You have a fever.  A wound that was closed breaks open.  You notice a bad smell coming from your wound or your dressing.  You notice something coming out of the wound, such as wood or glass.  Your pain is not controlled with medicine.  You have increased redness, swelling, or pain at the site of your wound.  You have fluid, blood, or pus coming from your wound.  You notice a change in the color of your skin near your wound.  You need to change the dressing frequently due to fluid, blood, or pus draining from the wound.  You develop a new rash.  You develop numbness around the wound. SEEK IMMEDIATE MEDICAL CARE IF:  You develop severe swelling around the wound.  Your pain suddenly increases and is severe.  You develop painful lumps near the wound or on skin that is anywhere on your body.  You have a red streak going away from your wound.  The wound is on your hand or foot and you cannot properly move a finger or  toe.  The wound is on your hand or foot and you notice that your fingers or toes look pale or bluish.   This information is not intended to replace advice given to you by your health care provider. Make sure you discuss any questions you have with your health care provider.   Document Released: 08/07/2005 Document Revised: 12/22/2014 Document Reviewed: 08/03/2014 Elsevier Interactive Patient Education 2016 [employer]  General Assault Assault includes any behavior or physical attack--whether it is on purpose or not--that results in injury to another person, damage to property, or both. This also includes assault that has not yet happened, but is planned to happen. Threats of assault may be physical, verbal, or written. They may be said or sent by:  Mail.  E-mail.  Text.  Social media.  Fax. The threats may be direct, implied, or understood. WHAT ARE THE DIFFERENT FORMS OF ASSAULT? Forms of assault include:  Physically assaulting a person. This includes physical threats to inflict physical harm as well as:  Slapping.  Hitting.  Poking.  Kicking.  Punching.  Pushing.  Sexually assaulting a person. Sexual assault is any sexual activity that a person is forced, threatened, or coerced to participate in. It may or may not involve physical contact with the person who is assaulting you. You are sexually assaulted if you are forced to have sexual contact of any kind.  Damaging or destroying a person's assistive equipment, such as glasses, canes, or walkers.  Throwing or hitting objects.  Using or displaying a weapon to harm or threaten someone.  Using or displaying an object that appears to be a weapon in a threatening manner.  Using greater physical size or strength to intimidate someone.  Making intimidating or threatening gestures.  Bullying.  Hazing.  Using language that is intimidating, threatening, hostile, or abusive.  Stalking.  Restraining someone with  force. WHAT SHOULD I DO IF I EXPERIENCE ASSAULT?  Report assaults, threats, and stalking to the police. Call your local emergency services (911 in the [place] if you  are in immediate danger or you need medical help.  You can work with a Clinical research associate or an advocate to get legal protection against someone who has assaulted you or threatened you with assault. Protection includes restraining orders and private addresses. Crimes against you, such as assault, can also be prosecuted through the courts. Laws will vary depending on where you live.   This information is not intended to replace advice given to you by your health care provider. Make sure you discuss any questions you have with your health care provider.   Document Released: 08/07/2005 Document Revised: 08/28/2014 Document Reviewed: 04/24/2014 Elsevier Interactive Patient Education [employer]
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
## 2015-07-26 ENCOUNTER — Emergency Department (HOSPITAL_BASED_OUTPATIENT_CLINIC_OR_DEPARTMENT_OTHER): Admission: EM | Admit: 2015-07-26 | Discharge: 2015-07-26 | Discharge status: Against Medical Advice | Payer: Self-pay

## 2015-07-26 NOTE — ED Notes (Signed)
Pt cussing on phone with someone loudly in waiting room. Asked patient to please lower voice as their are other people and patients in waiting room. Patient jumped up out of seat and said, " I will cuss that bitch out to if she talks to me again talking to this nurse. Pt proceeds out of doors beyond visual of ED doors. Security made aware.

## 2015-11-16 ENCOUNTER — Emergency Department (HOSPITAL_BASED_OUTPATIENT_CLINIC_OR_DEPARTMENT_OTHER): Payer: Medicaid Other

## 2015-11-16 ENCOUNTER — Emergency Department (HOSPITAL_BASED_OUTPATIENT_CLINIC_OR_DEPARTMENT_OTHER)
Admission: EM | Admit: 2015-11-16 | Discharge: 2015-11-16 | Disposition: A | Discharge status: Home / Self Care | Payer: Medicaid Other | Attending: Emergency Medicine | Admitting: Emergency Medicine

## 2015-11-16 ENCOUNTER — Encounter (HOSPITAL_BASED_OUTPATIENT_CLINIC_OR_DEPARTMENT_OTHER): Payer: Self-pay | Admitting: *Deleted

## 2015-11-16 DIAGNOSIS — F419 Anxiety disorder, unspecified: Secondary | ICD-10-CM | POA: Diagnosis not present

## 2015-11-16 DIAGNOSIS — N390 Urinary tract infection, site not specified: Secondary | ICD-10-CM | POA: Diagnosis not present

## 2015-11-16 DIAGNOSIS — R197 Diarrhea, unspecified: Secondary | ICD-10-CM | POA: Insufficient documentation

## 2015-11-16 DIAGNOSIS — R1011 Right upper quadrant pain: Secondary | ICD-10-CM | POA: Diagnosis present

## 2015-11-16 DIAGNOSIS — F17210 Nicotine dependence, cigarettes, uncomplicated: Secondary | ICD-10-CM | POA: Diagnosis not present

## 2015-11-16 DIAGNOSIS — Z79899 Other long term (current) drug therapy: Secondary | ICD-10-CM | POA: Insufficient documentation

## 2015-11-16 DIAGNOSIS — Z3202 Encounter for pregnancy test, result negative: Secondary | ICD-10-CM | POA: Diagnosis not present

## 2015-11-16 HISTORY — DX: Attention-deficit hyperactivity disorder, unspecified type: F90.9

## 2015-11-16 LAB — CBC WITH DIFFERENTIAL/PLATELET
BASOS ABS: 0 10*3/uL (ref 0.0–0.1)
Basophils Relative: 0 %
EOS ABS: 0.1 10*3/uL (ref 0.0–0.7)
Eosinophils Relative: 1 %
HCT: 44.4 % (ref 36.0–46.0)
Hemoglobin: 15 g/dL (ref 12.0–15.0)
Lymphocytes Relative: 21 %
Lymphs Abs: 1.8 10*3/uL (ref 0.7–4.0)
MCH: 29.2 pg (ref 26.0–34.0)
MCHC: 33.8 g/dL (ref 30.0–36.0)
MCV: 86.4 fL (ref 78.0–100.0)
Monocytes Absolute: 1.8 10*3/uL — ABNORMAL HIGH (ref 0.1–1.0)
Monocytes Relative: 21 %
NEUTROS PCT: 57 %
Neutro Abs: 5.1 10*3/uL (ref 1.7–7.7)
PLATELETS: 320 10*3/uL (ref 150–400)
RBC: 5.14 MIL/uL — ABNORMAL HIGH (ref 3.87–5.11)
RDW: 13.4 % (ref 11.5–15.5)
WBC: 8.8 10*3/uL (ref 4.0–10.5)

## 2015-11-16 LAB — COMPREHENSIVE METABOLIC PANEL
ALK PHOS: 76 U/L (ref 38–126)
ALT: 26 U/L (ref 14–54)
AST: 27 U/L (ref 15–41)
Albumin: 3.6 g/dL (ref 3.5–5.0)
Anion gap: 9 (ref 5–15)
BILIRUBIN TOTAL: 0.5 mg/dL (ref 0.3–1.2)
BUN: <5 mg/dL — ABNORMAL LOW (ref 6–20)
CALCIUM: 9.1 mg/dL (ref 8.9–10.3)
CO2: 26 mmol/L (ref 22–32)
Chloride: 98 mmol/L — ABNORMAL LOW (ref 101–111)
Creatinine, Ser: 0.63 mg/dL (ref 0.44–1.00)
Glucose, Bld: 103 mg/dL — ABNORMAL HIGH (ref 65–99)
Potassium: 3.5 mmol/L (ref 3.5–5.1)
Sodium: 133 mmol/L — ABNORMAL LOW (ref 135–145)
TOTAL PROTEIN: 8 g/dL (ref 6.5–8.1)

## 2015-11-16 LAB — PREGNANCY, URINE: Preg Test, Ur: NEGATIVE

## 2015-11-16 LAB — URINE MICROSCOPIC-ADD ON

## 2015-11-16 LAB — LIPASE, BLOOD: LIPASE: 15 U/L (ref 11–51)

## 2015-11-16 LAB — URINALYSIS, ROUTINE W REFLEX MICROSCOPIC
BILIRUBIN URINE: NEGATIVE
GLUCOSE, UA: NEGATIVE mg/dL
KETONES UR: NEGATIVE mg/dL
Nitrite: POSITIVE — AB
PROTEIN: NEGATIVE mg/dL
Specific Gravity, Urine: 1.012 (ref 1.005–1.030)
pH: 6.5 (ref 5.0–8.0)

## 2015-11-16 MED ORDER — CEPHALEXIN 500 MG PO CAPS: 500.0000 mg | ORAL_CAPSULE | Freq: Four times a day (QID) | ORAL | Status: DC

## 2015-11-16 MED FILL — CEPHALEXIN 500 MG CAPSULE: 500 | 7 days supply | Qty: 28 | Fill #0

## 2015-11-16 NOTE — ED Notes (Signed)
Pt reports ruq pain radiating to right flank area x several weeks. Pt denies any change today, states "I'm just tired of hurting." pt states pain is sharp, intermittent, reports diarrhea yesterday, no n/v.

## 2015-11-16 NOTE — ED Notes (Signed)
Pt directed to pharmacy to pick up medications. [facility] sheet for [facility] given to pt at d/c

## 2015-11-16 NOTE — Discharge Instructions (Signed)
Keflex as prescribed.  Ibuprofen 600 mg every 6 hours as needed for pain.  Return to the emergency department if you develop worsening pain, high fever, bloody stools, or other new and concerning symptoms.   Urinary Tract Infection Urinary tract infections (UTIs) can develop anywhere along your urinary tract. Your urinary tract is your body's drainage system for removing wastes and extra water. Your urinary tract includes two kidneys, two ureters, a bladder, and a urethra. Your kidneys are a pair of bean-shaped organs. Each kidney is about the size of your fist. They are located below your ribs, one on each side of your spine. CAUSES Infections are caused by microbes, which are microscopic organisms, including fungi, viruses, and bacteria. These organisms are so small that they can only be seen through a microscope. Bacteria are the microbes that most commonly cause UTIs. SYMPTOMS  Symptoms of UTIs may vary by age and gender of the patient and by the location of the infection. Symptoms in young women typically include a frequent and intense urge to urinate and a painful, burning feeling in the bladder or urethra during urination. Older women and men are more likely to be tired, shaky, and weak and have muscle aches and abdominal pain. A fever may mean the infection is in your kidneys. Other symptoms of a kidney infection include pain in your back or sides below the ribs, nausea, and vomiting. DIAGNOSIS To diagnose a UTI, your caregiver will ask you about your symptoms. Your caregiver will also ask you to provide a urine sample. The urine sample will be tested for bacteria and white blood cells. White blood cells are made by your body to help fight infection. TREATMENT  Typically, UTIs can be treated with medication. Because most UTIs are caused by a bacterial infection, they usually can be treated with the use of antibiotics. The choice of antibiotic and length of treatment depend on your symptoms  and the type of bacteria causing your infection. HOME CARE INSTRUCTIONS  If you were prescribed antibiotics, take them exactly as your caregiver instructs you. Finish the medication even if you feel better after you have only taken some of the medication.  Drink enough water and fluids to keep your urine clear or pale yellow.  Avoid caffeine, tea, and carbonated beverages. They tend to irritate your bladder.  Empty your bladder often. Avoid holding urine for long periods of time.  Empty your bladder before and after sexual intercourse.  After a bowel movement, women should cleanse from front to back. Use each tissue only once. SEEK MEDICAL CARE IF:   You have back pain.  You develop a fever.  Your symptoms do not begin to resolve within 3 days. SEEK IMMEDIATE MEDICAL CARE IF:   You have severe back pain or lower abdominal pain.  You develop chills.  You have nausea or vomiting.  You have continued burning or discomfort with urination. MAKE SURE YOU:   Understand these instructions.  Will watch your condition.  Will get help right away if you are not doing well or get worse.   This information is not intended to replace advice given to you by your health care provider. Make sure you discuss any questions you have with your health care provider.   Document Released: 05/17/2005 Document Revised: 04/28/2015 Document Reviewed: 09/15/2011 Elsevier Interactive Patient Education 2016 [employer]  Abdominal Pain, Adult Many things can cause abdominal pain. Usually, abdominal pain is not caused by a disease and will improve without treatment.  It can often be observed and treated at home. Your health care provider will do a physical exam and possibly order blood tests and X-rays to help determine the seriousness of your pain. However, in many cases, more time must pass before a clear cause of the pain can be found. Before that point, your health care provider may not know if you  need more testing or further treatment. HOME CARE INSTRUCTIONS Monitor your abdominal pain for any changes. The following actions may help to alleviate any discomfort you are experiencing:  Only take over-the-counter or prescription medicines as directed by your health care provider.  Do not take laxatives unless directed to do so by your health care provider.  Try a clear liquid diet (broth, tea, or water) as directed by your health care provider. Slowly move to a bland diet as tolerated. SEEK MEDICAL CARE IF:  You have unexplained abdominal pain.  You have abdominal pain associated with nausea or diarrhea.  You have pain when you urinate or have a bowel movement.  You experience abdominal pain that wakes you in the night.  You have abdominal pain that is worsened or improved by eating food.  You have abdominal pain that is worsened with eating fatty foods.  You have a fever. SEEK IMMEDIATE MEDICAL CARE IF:  Your pain does not go away within 2 hours.  You keep throwing up (vomiting).  Your pain is felt only in portions of the abdomen, such as the right side or the left lower portion of the abdomen.  You pass bloody or black tarry stools. MAKE SURE YOU:  Understand these instructions.  Will watch your condition.  Will get help right away if you are not doing well or get worse.   This information is not intended to replace advice given to you by your health care provider. Make sure you discuss any questions you have with your health care provider.   Document Released: 05/17/2005 Document Revised: 04/28/2015 Document Reviewed: 04/16/2013 Elsevier Interactive Patient Education [employer]

## 2015-11-16 NOTE — ED Notes (Signed)
MD at bedside. 

## 2015-11-16 NOTE — ED Provider Notes (Signed)
CSN: [id number]     Arrival date & time 11/16/15  [id number] History   First MD Initiated Contact with Patient 11/16/15 [id number]     Chief Complaint  Patient presents with  . Abdominal Pain     (Consider location/radiation/quality/duration/timing/severity/associated sxs/prior Treatment) HPI Comments: Patient is a 20 year old female with history of anxiety and ADHD. She presents for a three-week history of right upper quadrant pain that has been occurring intermittently. This is unrelated to food or eating. She denies any fevers or chills. She denies any urinary complaints. Her last menstrual period was last month and was normal.  Patient is a 20 y.o. female presenting with abdominal pain. The history is provided by the patient.  Abdominal Pain Pain location:  RUQ Pain quality: cramping   Pain radiates to:  Does not radiate Pain severity:  Moderate Onset quality:  Sudden Timing:  Constant Progression:  Worsening Relieved by:  Nothing Worsened by:  Nothing tried Ineffective treatments:  None tried Associated symptoms: diarrhea and nausea   Associated symptoms: no constipation and no fever     Past Medical History  Diagnosis Date  . Anxiety   . ADHD (attention deficit hyperactivity disorder)    Past Surgical History  Procedure Laterality Date  . Closed reduction finger with percutaneous pinning Right 05/30/2015    Procedure: CLOSED REDUCTION FINGER WITH PERCUTANEOUS PINNING;  Surgeon: Dominica SeverinWilliam Gramig, MD;  Location: [facility] OR;  Service: Orthopedics;  Laterality: Right;   History reviewed. No pertinent family history. Social History  Substance Use Topics  . Smoking status: Current Every Day Smoker -- 1.00 packs/day    Types: Cigarettes  . Smokeless tobacco: None  . Alcohol Use: Yes     Comment: occ    OB History    No data available     Review of Systems  Constitutional: Negative for fever.  Gastrointestinal: Positive for nausea, abdominal pain and diarrhea. Negative for  constipation.  All other systems reviewed and are negative.     Allergies  Review of patient's allergies indicates no known allergies.  Home Medications   Prior to Admission medications   Medication Sig Start Date End Date Taking? Authorizing Provider  citalopram (CELEXA) 10 MG tablet Take 10 mg by mouth daily.   Yes Historical Provider, MD   BP 120/72 mmHg  Pulse 94  Temp(Src) 98.4 F (36.9 C) (Oral)  Resp 16  Ht 5\' 2"  (1.575 m)  Wt 156 lb (70.761 kg)  BMI 28.53 kg/m2  SpO2 98%  LMP 11/16/2015 Physical Exam  Constitutional: She is oriented to person, place, and time. She appears well-developed and well-nourished. No distress.  HENT:  Head: Normocephalic and atraumatic.  Neck: Normal range of motion. Neck supple.  Cardiovascular: Normal rate and regular rhythm.  Exam reveals no gallop and no friction rub.   No murmur heard. Pulmonary/Chest: Effort normal and breath sounds normal. No respiratory distress. She has no wheezes.  Abdominal: Soft. Bowel sounds are normal. She exhibits no distension. There is tenderness. There is no rebound and no guarding.  There is mild tenderness to palpation in the right upper quadrant and right flank.  Musculoskeletal: Normal range of motion.  Neurological: She is alert and oriented to person, place, and time.  Skin: Skin is warm and dry. She is not diaphoretic.  Nursing note and vitals reviewed.   ED Course  Procedures (including critical care time) Labs Review Labs Reviewed  COMPREHENSIVE METABOLIC PANEL  CBC WITH DIFFERENTIAL/PLATELET  LIPASE, BLOOD  URINALYSIS, ROUTINE W REFLEX  MICROSCOPIC (NOT AT [facility])  PREGNANCY, URINE    Imaging Review No results found. I have personally reviewed and evaluated these images and lab results as part of my medical decision-making.   EKG Interpretation None      MDM   Final diagnoses:  RUQ pain    Laboratory studies are unremarkable and ultrasound shows no evidence for gallstones.  She does have evidence for a UTI which will be treated with Keflex. She is return as needed for any problems. I highly doubt anything emergently surgical.    Geoffery Lyons, MD 11/16/15 1204

## 2015-12-04 ENCOUNTER — Encounter (HOSPITAL_BASED_OUTPATIENT_CLINIC_OR_DEPARTMENT_OTHER): Payer: Self-pay

## 2015-12-04 ENCOUNTER — Emergency Department (HOSPITAL_BASED_OUTPATIENT_CLINIC_OR_DEPARTMENT_OTHER): Payer: Medicaid Other

## 2015-12-04 ENCOUNTER — Emergency Department (HOSPITAL_BASED_OUTPATIENT_CLINIC_OR_DEPARTMENT_OTHER)
Admission: EM | Admit: 2015-12-04 | Discharge: 2015-12-04 | Disposition: A | Discharge status: Home / Self Care | Payer: Medicaid Other | Attending: Emergency Medicine | Admitting: Emergency Medicine

## 2015-12-04 DIAGNOSIS — Y998 Other external cause status: Secondary | ICD-10-CM | POA: Diagnosis not present

## 2015-12-04 DIAGNOSIS — S20222A Contusion of left back wall of thorax, initial encounter: Secondary | ICD-10-CM | POA: Diagnosis not present

## 2015-12-04 DIAGNOSIS — Z79899 Other long term (current) drug therapy: Secondary | ICD-10-CM | POA: Diagnosis not present

## 2015-12-04 DIAGNOSIS — S199XXA Unspecified injury of neck, initial encounter: Secondary | ICD-10-CM | POA: Diagnosis not present

## 2015-12-04 DIAGNOSIS — S4992XA Unspecified injury of left shoulder and upper arm, initial encounter: Secondary | ICD-10-CM | POA: Diagnosis present

## 2015-12-04 DIAGNOSIS — F17210 Nicotine dependence, cigarettes, uncomplicated: Secondary | ICD-10-CM | POA: Diagnosis not present

## 2015-12-04 DIAGNOSIS — Y9289 Other specified places as the place of occurrence of the external cause: Secondary | ICD-10-CM | POA: Diagnosis not present

## 2015-12-04 DIAGNOSIS — S300XXA Contusion of lower back and pelvis, initial encounter: Secondary | ICD-10-CM | POA: Diagnosis not present

## 2015-12-04 DIAGNOSIS — Y9389 Activity, other specified: Secondary | ICD-10-CM | POA: Insufficient documentation

## 2015-12-04 DIAGNOSIS — S40022A Contusion of left upper arm, initial encounter: Secondary | ICD-10-CM | POA: Insufficient documentation

## 2015-12-04 DIAGNOSIS — F419 Anxiety disorder, unspecified: Secondary | ICD-10-CM | POA: Insufficient documentation

## 2015-12-04 DIAGNOSIS — S40012A Contusion of left shoulder, initial encounter: Secondary | ICD-10-CM | POA: Insufficient documentation

## 2015-12-04 DIAGNOSIS — T07XXXA Unspecified multiple injuries, initial encounter: Secondary | ICD-10-CM

## 2015-12-04 MED ORDER — HYDROCODONE-ACETAMINOPHEN 5-325 MG PO TABS: 1.0000 | ORAL_TABLET | ORAL | Status: AC | PRN

## 2015-12-04 MED ORDER — IBUPROFEN 600 MG PO TABS: 600.0000 mg | ORAL_TABLET | Freq: Four times a day (QID) | ORAL | Status: AC | PRN

## 2015-12-04 MED ORDER — HYDROCODONE-ACETAMINOPHEN 5-325 MG PO TABS
1.0000 | ORAL_TABLET | Freq: Once | ORAL | Status: AC
  Administered 2015-12-04: 1 via ORAL
  Filled 2015-12-04: qty 1
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
## 2015-12-04 NOTE — ED Notes (Signed)
MD at bedside. 

## 2015-12-04 NOTE — ED Notes (Signed)
Patient transported to X-ray 
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
## 2015-12-04 NOTE — Discharge Instructions (Signed)
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
## 2016-06-21 ENCOUNTER — Encounter (HOSPITAL_BASED_OUTPATIENT_CLINIC_OR_DEPARTMENT_OTHER): Payer: Self-pay | Admitting: *Deleted

## 2016-06-21 ENCOUNTER — Emergency Department (HOSPITAL_BASED_OUTPATIENT_CLINIC_OR_DEPARTMENT_OTHER)
Admission: EM | Admit: 2016-06-21 | Discharge: 2016-06-21 | Disposition: A | Discharge status: Home / Self Care | Payer: Medicare Other | Attending: Emergency Medicine | Admitting: Emergency Medicine

## 2016-06-21 ENCOUNTER — Emergency Department (HOSPITAL_BASED_OUTPATIENT_CLINIC_OR_DEPARTMENT_OTHER): Payer: Medicare Other

## 2016-06-21 DIAGNOSIS — F909 Attention-deficit hyperactivity disorder, unspecified type: Secondary | ICD-10-CM | POA: Insufficient documentation

## 2016-06-21 DIAGNOSIS — Y999 Unspecified external cause status: Secondary | ICD-10-CM | POA: Diagnosis not present

## 2016-06-21 DIAGNOSIS — X501XXA Overexertion from prolonged static or awkward postures, initial encounter: Secondary | ICD-10-CM | POA: Insufficient documentation

## 2016-06-21 DIAGNOSIS — S93401A Sprain of unspecified ligament of right ankle, initial encounter: Secondary | ICD-10-CM | POA: Insufficient documentation

## 2016-06-21 DIAGNOSIS — Y92410 Unspecified street and highway as the place of occurrence of the external cause: Secondary | ICD-10-CM | POA: Diagnosis not present

## 2016-06-21 DIAGNOSIS — Z79899 Other long term (current) drug therapy: Secondary | ICD-10-CM | POA: Insufficient documentation

## 2016-06-21 DIAGNOSIS — S99911A Unspecified injury of right ankle, initial encounter: Secondary | ICD-10-CM | POA: Diagnosis present

## 2016-06-21 DIAGNOSIS — F17210 Nicotine dependence, cigarettes, uncomplicated: Secondary | ICD-10-CM | POA: Diagnosis not present

## 2016-06-21 DIAGNOSIS — Y9339 Activity, other involving climbing, rappelling and jumping off: Secondary | ICD-10-CM | POA: Insufficient documentation
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
## 2016-06-21 NOTE — Discharge Instructions (Signed)
You can take ibuprofen, available over the counter according to label instructions as needed for pain.   °
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
## 2017-11-27 DIAGNOSIS — S8991XA Unspecified injury of right lower leg, initial encounter: Secondary | ICD-10-CM | POA: Diagnosis not present

## 2017-11-27 DIAGNOSIS — S90414A Abrasion, right lesser toe(s), initial encounter: Secondary | ICD-10-CM | POA: Diagnosis not present

## 2017-11-27 DIAGNOSIS — S90811A Abrasion, right foot, initial encounter: Secondary | ICD-10-CM | POA: Diagnosis not present

## 2017-11-27 DIAGNOSIS — M25562 Pain in left knee: Secondary | ICD-10-CM | POA: Diagnosis not present

## 2017-11-27 DIAGNOSIS — S8992XA Unspecified injury of left lower leg, initial encounter: Secondary | ICD-10-CM | POA: Diagnosis not present

## 2017-11-27 DIAGNOSIS — M79674 Pain in right toe(s): Secondary | ICD-10-CM | POA: Diagnosis not present

## 2017-11-27 DIAGNOSIS — M25561 Pain in right knee: Secondary | ICD-10-CM | POA: Diagnosis not present

## 2017-11-27 DIAGNOSIS — Z23 Encounter for immunization: Secondary | ICD-10-CM | POA: Diagnosis not present

## 2018-01-15 DIAGNOSIS — E785 Hyperlipidemia, unspecified: Secondary | ICD-10-CM | POA: Diagnosis not present

## 2018-01-15 DIAGNOSIS — Z131 Encounter for screening for diabetes mellitus: Secondary | ICD-10-CM | POA: Diagnosis not present

## 2018-01-15 DIAGNOSIS — Z720 Tobacco use: Secondary | ICD-10-CM | POA: Diagnosis not present

## 2018-12-06 DIAGNOSIS — K2971 Gastritis, unspecified, with bleeding: Secondary | ICD-10-CM | POA: Diagnosis not present

## 2018-12-06 DIAGNOSIS — R1030 Lower abdominal pain, unspecified: Secondary | ICD-10-CM | POA: Diagnosis not present

## 2018-12-06 DIAGNOSIS — K922 Gastrointestinal hemorrhage, unspecified: Secondary | ICD-10-CM | POA: Diagnosis not present

## 2018-12-06 DIAGNOSIS — E860 Dehydration: Secondary | ICD-10-CM | POA: Diagnosis not present

## 2018-12-06 DIAGNOSIS — K625 Hemorrhage of anus and rectum: Secondary | ICD-10-CM | POA: Diagnosis not present

## 2018-12-06 DIAGNOSIS — K2901 Acute gastritis with bleeding: Secondary | ICD-10-CM | POA: Diagnosis not present

## 2018-12-06 DIAGNOSIS — Z720 Tobacco use: Secondary | ICD-10-CM | POA: Diagnosis not present

## 2018-12-16 DIAGNOSIS — Z01021 Encounter for examination of eyes and vision following failed vision screening with abnormal findings: Secondary | ICD-10-CM | POA: Diagnosis not present

## 2018-12-16 DIAGNOSIS — Z01118 Encounter for examination of ears and hearing with other abnormal findings: Secondary | ICD-10-CM | POA: Diagnosis not present

## 2018-12-16 DIAGNOSIS — Z1389 Encounter for screening for other disorder: Secondary | ICD-10-CM | POA: Diagnosis not present

## 2018-12-16 DIAGNOSIS — Z5181 Encounter for therapeutic drug level monitoring: Secondary | ICD-10-CM | POA: Diagnosis not present

## 2018-12-16 DIAGNOSIS — Z1329 Encounter for screening for other suspected endocrine disorder: Secondary | ICD-10-CM | POA: Diagnosis not present

## 2018-12-16 DIAGNOSIS — E782 Mixed hyperlipidemia: Secondary | ICD-10-CM | POA: Diagnosis not present

## 2018-12-16 DIAGNOSIS — Z136 Encounter for screening for cardiovascular disorders: Secondary | ICD-10-CM | POA: Diagnosis not present

## 2018-12-16 DIAGNOSIS — Z131 Encounter for screening for diabetes mellitus: Secondary | ICD-10-CM | POA: Diagnosis not present

## 2018-12-16 DIAGNOSIS — Z0001 Encounter for general adult medical examination with abnormal findings: Secondary | ICD-10-CM | POA: Diagnosis not present

## 2019-01-06 DIAGNOSIS — E782 Mixed hyperlipidemia: Secondary | ICD-10-CM | POA: Diagnosis not present

## 2019-01-06 DIAGNOSIS — K219 Gastro-esophageal reflux disease without esophagitis: Secondary | ICD-10-CM | POA: Diagnosis not present

## 2019-01-06 DIAGNOSIS — J4520 Mild intermittent asthma, uncomplicated: Secondary | ICD-10-CM | POA: Diagnosis not present

## 2019-01-06 DIAGNOSIS — Z0001 Encounter for general adult medical examination with abnormal findings: Secondary | ICD-10-CM | POA: Diagnosis not present

## 2019-01-06 DIAGNOSIS — Z720 Tobacco use: Secondary | ICD-10-CM | POA: Diagnosis not present

## 2019-02-16 DIAGNOSIS — S71112A Laceration without foreign body, left thigh, initial encounter: Secondary | ICD-10-CM | POA: Diagnosis not present

## 2019-02-16 DIAGNOSIS — Z23 Encounter for immunization: Secondary | ICD-10-CM | POA: Diagnosis not present

## 2019-02-16 DIAGNOSIS — Y999 Unspecified external cause status: Secondary | ICD-10-CM | POA: Diagnosis not present

## 2019-02-16 DIAGNOSIS — W540XXA Bitten by dog, initial encounter: Secondary | ICD-10-CM | POA: Diagnosis not present

## 2019-02-16 DIAGNOSIS — Z203 Contact with and (suspected) exposure to rabies: Secondary | ICD-10-CM | POA: Diagnosis not present

## 2019-02-26 DIAGNOSIS — Z720 Tobacco use: Secondary | ICD-10-CM | POA: Diagnosis not present

## 2019-02-26 DIAGNOSIS — K219 Gastro-esophageal reflux disease without esophagitis: Secondary | ICD-10-CM | POA: Diagnosis not present

## 2019-02-26 DIAGNOSIS — J4520 Mild intermittent asthma, uncomplicated: Secondary | ICD-10-CM | POA: Diagnosis not present

## 2019-02-26 DIAGNOSIS — E782 Mixed hyperlipidemia: Secondary | ICD-10-CM | POA: Diagnosis not present

## 2019-02-28 DIAGNOSIS — S71112D Laceration without foreign body, left thigh, subsequent encounter: Secondary | ICD-10-CM | POA: Diagnosis not present

## 2019-03-03 DIAGNOSIS — K219 Gastro-esophageal reflux disease without esophagitis: Secondary | ICD-10-CM | POA: Diagnosis not present

## 2019-03-03 DIAGNOSIS — E782 Mixed hyperlipidemia: Secondary | ICD-10-CM | POA: Diagnosis not present

## 2019-03-03 DIAGNOSIS — J4520 Mild intermittent asthma, uncomplicated: Secondary | ICD-10-CM | POA: Diagnosis not present

## 2019-03-03 DIAGNOSIS — Z720 Tobacco use: Secondary | ICD-10-CM | POA: Diagnosis not present

## 2019-04-13 ENCOUNTER — Encounter (HOSPITAL_BASED_OUTPATIENT_CLINIC_OR_DEPARTMENT_OTHER): Payer: Self-pay | Admitting: Emergency Medicine

## 2019-04-13 ENCOUNTER — Emergency Department (HOSPITAL_BASED_OUTPATIENT_CLINIC_OR_DEPARTMENT_OTHER)
Admission: EM | Admit: 2019-04-13 | Discharge: 2019-04-13 | Disposition: A | Discharge status: Home / Self Care | Payer: Medicare Other | Attending: Emergency Medicine | Admitting: Emergency Medicine

## 2019-04-13 ENCOUNTER — Other Ambulatory Visit: Payer: Self-pay

## 2019-04-13 DIAGNOSIS — Z79899 Other long term (current) drug therapy: Secondary | ICD-10-CM | POA: Diagnosis not present

## 2019-04-13 DIAGNOSIS — T7840XA Allergy, unspecified, initial encounter: Secondary | ICD-10-CM | POA: Diagnosis not present

## 2019-04-13 DIAGNOSIS — T63481A Toxic effect of venom of other arthropod, accidental (unintentional), initial encounter: Secondary | ICD-10-CM | POA: Diagnosis not present

## 2019-04-13 DIAGNOSIS — F909 Attention-deficit hyperactivity disorder, unspecified type: Secondary | ICD-10-CM | POA: Insufficient documentation

## 2019-04-13 DIAGNOSIS — R220 Localized swelling, mass and lump, head: Secondary | ICD-10-CM | POA: Diagnosis present

## 2019-04-13 DIAGNOSIS — F17210 Nicotine dependence, cigarettes, uncomplicated: Secondary | ICD-10-CM | POA: Diagnosis not present

## 2019-04-13 MED ORDER — PREDNISONE 50 MG PO TABS
60.0000 mg | ORAL_TABLET | Freq: Once | ORAL | Status: AC
  Administered 2019-04-13: 60 mg via ORAL
  Filled 2019-04-13: qty 1

## 2019-04-13 MED ORDER — PREDNISONE 10 MG (21) PO TBPK: ORAL_TABLET | Freq: Every day | ORAL | 0 refills | Status: AC

## 2019-04-13 MED ORDER — DIPHENHYDRAMINE HCL 25 MG PO TABS: 25.0000 mg | ORAL_TABLET | Freq: Four times a day (QID) | ORAL | 0 refills | Status: AC | PRN

## 2019-04-13 MED ORDER — DIPHENHYDRAMINE HCL 25 MG PO CAPS
25.0000 mg | ORAL_CAPSULE | Freq: Once | ORAL | Status: AC
  Administered 2019-04-13: 25 mg via ORAL
  Filled 2019-04-13: qty 1

## 2019-04-13 NOTE — ED Provider Notes (Signed)
[facility] EMERGENCY DEPARTMENT Provider Note   CSN: [id number] Arrival date & time: 04/13/19  1305     History   Chief Complaint Chief Complaint  Patient presents with  . Facial Swelling    HPI Tanya Collins is a 23 y.o. female who presents to ED for evaluation of left-sided facial swelling and concern for allergic reaction.  States that she was at her friend's house yesterday when she felt like she was bit by a bug.  She woke up this morning with swelling to the left side of her face, itching.  She denies history of similar symptoms in the past and has no known allergies.  She has not tried medications but did try to wash the area with alcohol, soap and water last night.  She denies any lip swelling, trouble breathing or trouble swallowing, history of angioedema or anaphylaxis, dental pain, fever, neck stiffness, changes to voice, drooling.     HPI  Past Medical History:  Diagnosis Date  . ADHD (attention deficit hyperactivity disorder)   . Anxiety     There are no active problems to display for this patient.   Past Surgical History:  Procedure Laterality Date  . CLOSED REDUCTION FINGER WITH PERCUTANEOUS PINNING Right 05/30/2015   Procedure: CLOSED REDUCTION FINGER WITH PERCUTANEOUS PINNING;  Surgeon: Dominica SeverinWilliam Gramig, MD;  Location: [facility] OR;  Service: Orthopedics;  Laterality: Right;     OB History   No obstetric history on file.      Home Medications    Prior to Admission medications   Medication Sig Start Date End Date Taking? Authorizing Provider  citalopram (CELEXA) 10 MG tablet Take 10 mg by mouth daily.    [provider]  diphenhydrAMINE (BENADRYL) 25 MG tablet Take 1 tablet (25 mg total) by mouth every 6 (six) hours as needed. 04/13/19   Ayron Fillinger, PA-C  HYDROcodone-acetaminophen (NORCO) 5-325 MG tablet Take 1 tablet by mouth every 4 (four) hours as needed for moderate pain or severe pain. 12/04/15   Loren RacerYelverton, David, MD  ibuprofen  (ADVIL,MOTRIN) 600 MG tablet Take 1 tablet (600 mg total) by mouth every 6 (six) hours as needed. 12/04/15   Loren RacerYelverton, David, MD  predniSONE (STERAPRED UNI-PAK 21 TAB) 10 MG (21) TBPK tablet Take by mouth daily. Take 6 tabs by mouth daily  for 2 days, then 5 tabs for 2 days, then 4 tabs for 2 days, then 3 tabs for 2 days, 2 tabs for 2 days, then 1 tab by mouth daily for 2 days 04/13/19   Dietrich PatesKhatri, Peachie Barkalow, PA-C    Family History No family history on file.  Social History Social History   Tobacco Use  . Smoking status: Current Every Day Smoker    Packs/day: 1.00    Types: Cigarettes  . Smokeless tobacco: Never Used  Substance Use Topics  . Alcohol use: Yes    Comment: occ   . Drug use: Yes    Types: Marijuana     Allergies   Patient has no known allergies.   Review of Systems Review of Systems  Constitutional: Negative for chills and fever.  HENT: Positive for facial swelling. Negative for sinus pressure, sinus pain and trouble swallowing.   Musculoskeletal: Negative for neck pain and neck stiffness.  Skin: Positive for rash.     Physical Exam Updated Vital Signs BP 137/87 (BP Location: Right Arm)   Pulse 77   Temp 97.9 F (36.6 C) (Oral)   Resp 18   Ht 5'  2" (1.575 m)   Wt 72.6 kg   LMP 04/05/2019   SpO2 99%   BMI 29.26 kg/m   Physical Exam Vitals signs and nursing note reviewed.  Constitutional:      General: She is not in acute distress.    Appearance: She is well-developed. She is not diaphoretic.     Comments: Speaking in complete sentences without difficulty.  No signs of angioedema or anaphylaxis.  HENT:     Head: Normocephalic and atraumatic.     Comments: Diffuse edema of left side of cheek and jaw. No pooling of secretions or trismus.  Normal voice noted with no difficulty swallowing or breathing. No submandibular erythema, edema or crepitus noted.  Eyes:     General: No scleral icterus.    Conjunctiva/sclera: Conjunctivae normal.  Neck:      Musculoskeletal: Normal range of motion.  Cardiovascular:     Rate and Rhythm: Normal rate and regular rhythm.  Pulmonary:     Effort: Pulmonary effort is normal. No respiratory distress.     Breath sounds: Normal breath sounds.  Skin:    Findings: No rash.  Neurological:     Mental Status: She is alert.      ED Treatments / Results  Labs (all labs ordered are listed, but only abnormal results are displayed) Labs Reviewed - No data to display  EKG None  Radiology No results found.  Procedures Procedures (including critical care time)  Medications Ordered in ED Medications  predniSONE (DELTASONE) tablet 60 mg (has no administration in time range)  diphenhydrAMINE (BENADRYL) capsule 25 mg (has no administration in time range)     Initial Impression / Assessment and Plan / ED Course  I have reviewed the triage vital signs and the nursing notes.  Pertinent labs & imaging results that were available during my care of the patient were reviewed by me and considered in my medical decision making (see chart for details).        23 year old female presents to ED for possible allergic reaction.  States that she believes she was bit by bugs on the left side of her face last night at a friend's house.  Woke up this morning with swelling.  There is some edema noted to left side of the face but does not appear to be dental in origin.  She is having no evidence of angioedema or anaphylaxis, Ludwig's angina.  Suspect this is all a localized reaction from an irritant, possibly bug bite.  She is speaking complete sentence without difficulty no acute distress, vital signs are within normal limits.  Patient given antihistamines, steroids here and will complete steroid course at home.  Patient agreeable to the plan.  Advised to return for worsening symptoms.  Patient is hemodynamically stable, in NAD, and able to ambulate in the ED. Evaluation does not show pathology that would require  ongoing emergent intervention or inpatient treatment. I explained the diagnosis to the patient. Pain has been managed and has no complaints prior to discharge. Patient is comfortable with above plan and is stable for discharge at this time. All questions were answered prior to disposition. Strict return precautions for returning to the ED were discussed. Encouraged follow up with PCP.   An After Visit Summary was printed and given to the patient.   Portions of this note were generated with Scientist, clinical (histocompatibility and immunogenetics)Dragon dictation software. Dictation errors may occur despite best attempts at proofreading.  Final Clinical Impressions(s) / ED Diagnoses   Final diagnoses:  Allergic  reaction, initial encounter    ED Discharge Orders         Ordered    predniSONE (STERAPRED UNI-PAK 21 TAB) 10 MG (21) TBPK tablet  Daily     04/13/19 1410    diphenhydrAMINE (BENADRYL) 25 MG tablet  Every 6 hours PRN     04/13/19 1410           Delia Heady, PA-C 04/13/19 1413    Varney Biles, MD 04/14/19 1515

## 2019-04-13 NOTE — ED Triage Notes (Addendum)
Woke with swelling to left side of face, thinks was bit by  bugs. Denies dental problems, itchy rash to face

## 2019-04-13 NOTE — Discharge Instructions (Signed)
Take the prednisone course beginning tomorrow. Please complete the entire course of this medicine even if her symptoms start to improve. Take Benadryl as needed for itching. Return to the ED if you start to have worsening symptoms, trouble breathing or trouble swallowing, lip swelling or neck stiffness with fever.

## 2019-06-09 DIAGNOSIS — E782 Mixed hyperlipidemia: Secondary | ICD-10-CM | POA: Diagnosis not present

## 2019-06-09 DIAGNOSIS — Z720 Tobacco use: Secondary | ICD-10-CM | POA: Diagnosis not present

## 2019-06-09 DIAGNOSIS — K219 Gastro-esophageal reflux disease without esophagitis: Secondary | ICD-10-CM | POA: Diagnosis not present

## 2019-06-09 DIAGNOSIS — J4520 Mild intermittent asthma, uncomplicated: Secondary | ICD-10-CM | POA: Diagnosis not present

## 2019-07-07 DIAGNOSIS — Z0000 Encounter for general adult medical examination without abnormal findings: Secondary | ICD-10-CM | POA: Diagnosis not present

## 2021-02-21 ENCOUNTER — Other Ambulatory Visit: Payer: Self-pay

## 2021-02-21 ENCOUNTER — Encounter (HOSPITAL_BASED_OUTPATIENT_CLINIC_OR_DEPARTMENT_OTHER): Payer: Self-pay | Admitting: *Deleted

## 2021-02-21 ENCOUNTER — Emergency Department (HOSPITAL_BASED_OUTPATIENT_CLINIC_OR_DEPARTMENT_OTHER)
Admission: EM | Admit: 2021-02-21 | Discharge: 2021-02-21 | Disposition: A | Discharge status: Home / Self Care | Payer: 59 | Attending: Emergency Medicine | Admitting: Emergency Medicine

## 2021-02-21 DIAGNOSIS — F17210 Nicotine dependence, cigarettes, uncomplicated: Secondary | ICD-10-CM | POA: Insufficient documentation

## 2021-02-21 DIAGNOSIS — N750 Cyst of Bartholin's gland: Secondary | ICD-10-CM | POA: Diagnosis not present

## 2021-02-21 DIAGNOSIS — R102 Pelvic and perineal pain: Secondary | ICD-10-CM | POA: Diagnosis present

## 2021-02-21 MED ORDER — SULFAMETHOXAZOLE-TRIMETHOPRIM 800-160 MG PO TABS: 1.0000 | ORAL_TABLET | Freq: Two times a day (BID) | ORAL | 0 refills | Status: AC

## 2021-02-21 NOTE — Discharge Instructions (Addendum)
I suspect you have enlargement of your bartholin gland I have started you on antibiotics please take as prescribed.  Also recommend sitz bath's please do this daily as well as  applying warm compresses to the area to help with drainage.  Please follow-up with OB/GYN for further evaluation.  Come back to the emergency department if you develop chest pain, shortness of breath, severe abdominal pain, uncontrolled nausea, vomiting, diarrhea.

## 2021-02-21 NOTE — ED Triage Notes (Signed)
Pt reports abcess to right labia area x 1 month. Has been seen by her pcp, but it returns. Pt requests a work note.

## 2021-02-21 NOTE — ED Provider Notes (Signed)
[facility] EMERGENCY DEPARTMENT Provider Note   CSN: [id number] Arrival date & time: 02/21/21  1629     History Chief Complaint  Patient presents with   Abscess    Tanya Collins is a 25 y.o. female.  HPI  Patient with significant medical history of anxiety presents  with chief complaint of abscess on her right labia.  Patient states she has had this abscess for 1 months time, states she first noticed it a month ago, and she was started on antibiotics.  She states that the abscess away went away but a week later after the antibiotics the abscess came back, she states that it has not gotten any larger in size, denies drainage or discharge, she denies increased pain or redness.  She has no urinary symptoms, no vaginal discharge or vaginal bleeding, she states that she is here because she just wants it removed.  She endorses that she was seen by her PCP and they scheduled her to follow-up with an OB but unfortunately could not get to that appointment.  She has no other complaints at this time.  She does not endorse fevers, chills, chest pain shortness of breath or abdominal pain.  Past Medical History:  Diagnosis Date   ADHD (attention deficit hyperactivity disorder)    Anxiety     There are no problems to display for this patient.   Past Surgical History:  Procedure Laterality Date   CLOSED REDUCTION FINGER WITH PERCUTANEOUS PINNING Right 05/30/2015   Procedure: CLOSED REDUCTION FINGER WITH PERCUTANEOUS PINNING;  Surgeon: Dominica Severin, MD;  Location: [facility] OR;  Service: Orthopedics;  Laterality: Right;     OB History   No obstetric history on file.     History reviewed. No pertinent family history.  Social History   Tobacco Use   Smoking status: Every Day    Packs/day: 1.00    Pack years: 0.00    Types: Cigarettes   Smokeless tobacco: Never  Substance Use Topics   Alcohol use: Yes    Comment: occ    Drug use: Yes    Types: Marijuana    Home  Medications Prior to Admission medications   Medication Sig Start Date End Date Taking? Authorizing Provider  sulfamethoxazole-trimethoprim (BACTRIM DS) 800-160 MG tablet Take 1 tablet by mouth 2 (two) times daily for 5 days. 02/21/21 02/26/21 Yes Carroll Sage, PA-C  citalopram (CELEXA) 10 MG tablet Take 10 mg by mouth daily.    [provider]  diphenhydrAMINE (BENADRYL) 25 MG tablet Take 1 tablet (25 mg total) by mouth every 6 (six) hours as needed. 04/13/19   Khatri, Hina, PA-C  HYDROcodone-acetaminophen (NORCO) 5-325 MG tablet Take 1 tablet by mouth every 4 (four) hours as needed for moderate pain or severe pain. 12/04/15   Loren Racer, MD  ibuprofen (ADVIL,MOTRIN) 600 MG tablet Take 1 tablet (600 mg total) by mouth every 6 (six) hours as needed. 12/04/15   Loren Racer, MD  predniSONE (STERAPRED UNI-PAK 21 TAB) 10 MG (21) TBPK tablet Take by mouth daily. Take 6 tabs by mouth daily  for 2 days, then 5 tabs for 2 days, then 4 tabs for 2 days, then 3 tabs for 2 days, 2 tabs for 2 days, then 1 tab by mouth daily for 2 days 04/13/19   Dietrich Pates, PA-C    Allergies    Patient has no known allergies.  Review of Systems   Review of Systems  Constitutional:  Negative for chills and fever.  HENT:  Negative for congestion.   Respiratory:  Negative for shortness of breath.   Cardiovascular:  Negative for chest pain.  Gastrointestinal:  Negative for abdominal pain.  Genitourinary:  Negative for enuresis.  Musculoskeletal:  Negative for back pain.  Skin:  Negative for rash.       Abscess on her right labia  Neurological:  Negative for dizziness.  Hematological:  Does not bruise/bleed easily.   Physical Exam Updated Vital Signs BP 109/67 (BP Location: Left Arm)   Pulse 86   Temp 98.1 F (36.7 C) (Oral)   Resp 18   Ht 5\' 2"  (1.575 m)   Wt 72.6 kg   LMP  (LMP Unknown)   SpO2 97%   BMI 29.26 kg/m   Physical Exam Vitals and nursing note reviewed. Exam conducted with a  chaperone present.  Constitutional:      General: She is not in acute distress.    Appearance: Normal appearance. She is not ill-appearing or diaphoretic.  HENT:     Head: Normocephalic and atraumatic.     Nose: No congestion or rhinorrhea.  Eyes:     General:        Right eye: No discharge.        Left eye: No discharge.     Conjunctiva/sclera: Conjunctivae normal.  Cardiovascular:     Rate and Rhythm: Normal rate and regular rhythm.  Pulmonary:     Effort: Pulmonary effort is normal.  Genitourinary:    Comments: With chaperone present genital exam was performed, there is a noted swelling on the posterior end of the right labia, area was palpated there is a enlarged cyst roughly where the bartholins gland would be, there is no fluctuance or induration present, no drainage or discharge noted, no erythema.  Area was nontender to palpation.  No other gross abnormalities present. Musculoskeletal:     Cervical back: Neck supple.     Right lower leg: No edema.     Left lower leg: No edema.  Skin:    General: Skin is warm and dry.     Coloration: Skin is not jaundiced or pale.  Neurological:     Mental Status: She is alert and oriented to person, place, and time.  Psychiatric:        Mood and Affect: Mood normal.    ED Results / Procedures / Treatments   Labs (all labs ordered are listed, but only abnormal results are displayed) Labs Reviewed - No data to display  EKG None  Radiology No results found.  Procedures Procedures   Medications Ordered in ED Medications - No data to display  ED Course  I have reviewed the triage vital signs and the nursing notes.  Pertinent labs & imaging results that were available during my care of the patient were reviewed by me and considered in my medical decision making (see chart for details).    MDM Rules/Calculators/A&P                         Initial impression-patient presents with cyst on her labia.  She is alert, does not  appear in acute stress, vital signs reassuring.  Will recommend visualization for further examination  Work-up-due to well-appearing patient, benign physical exam, further lab or imaging warranted at this time.  Rule out-I have low suspicion for labia abscess as the mass was noted more in the internal aspect of the vaginal canal there is no surrounding erythema or edema,  no drainage present.  Will defer I&D at this time as I  have low suspicion for abscess there is no fluctuance or induration present, no drainage or discharge noted.  Plan-  Cyst-suspect this is a Bartholin cyst unclear if abscess is present as I cannot palpate for fluctuance or induration, there is no surrounding erythema.  Will start patient on antibiotics as she states is improved her symptoms originally, will have her follow-up with OB for further evaluation.  Vital signs have remained stable, no indication for hospital admission.  Patient given at home care as well strict return precautions.  Patient verbalized that they understood agreed to said plan.  Final Clinical Impression(s) / ED Diagnoses Final diagnoses:  Cyst of right Bartholin's gland duct    Rx / DC Orders ED Discharge Orders          Ordered    sulfamethoxazole-trimethoprim (BACTRIM DS) 800-160 MG tablet  2 times daily        02/21/21 1733    Ambulatory referral to Gynecology        02/21/21 1733             Carroll Sage, PA-C 02/21/21 1733    Virgina Norfolk, DO 02/21/21 1740

## 2023-06-25 ENCOUNTER — Encounter (HOSPITAL_BASED_OUTPATIENT_CLINIC_OR_DEPARTMENT_OTHER): Payer: Self-pay | Admitting: *Deleted
